# Patient Record
Sex: Male | Born: 1945 | Race: Black or African American | Hispanic: No | Marital: Married | Smoking: Never smoker
Health system: Southern US, Community
[De-identification: ages and names within clinical notes are randomized; demographics above are authoritative.]

## PROBLEM LIST (undated history)

## (undated) DIAGNOSIS — G709 Myoneural disorder, unspecified: Secondary | ICD-10-CM

## (undated) DIAGNOSIS — E785 Hyperlipidemia, unspecified: Secondary | ICD-10-CM

## (undated) DIAGNOSIS — I10 Essential (primary) hypertension: Secondary | ICD-10-CM

## (undated) HISTORY — DX: Hyperlipidemia, unspecified: E78.5

## (undated) HISTORY — DX: Myoneural disorder, unspecified: G70.9

---

## 2010-11-11 ENCOUNTER — Emergency Department (HOSPITAL_COMMUNITY): Payer: Medicaid Other

## 2010-11-11 ENCOUNTER — Inpatient Hospital Stay (HOSPITAL_COMMUNITY)
Admission: EM | Admit: 2010-11-11 | Discharge: 2010-11-14 | DRG: 093 | Disposition: A | Discharge status: Home Health | Payer: Medicaid Other | Attending: Internal Medicine | Admitting: Internal Medicine

## 2010-11-11 DIAGNOSIS — Z8673 Personal history of transient ischemic attack (TIA), and cerebral infarction without residual deficits: Secondary | ICD-10-CM

## 2010-11-11 DIAGNOSIS — R29898 Other symptoms and signs involving the musculoskeletal system: Secondary | ICD-10-CM | POA: Diagnosis present

## 2010-11-11 DIAGNOSIS — I10 Essential (primary) hypertension: Secondary | ICD-10-CM | POA: Diagnosis present

## 2010-11-11 DIAGNOSIS — Z7982 Long term (current) use of aspirin: Secondary | ICD-10-CM

## 2010-11-11 DIAGNOSIS — R209 Unspecified disturbances of skin sensation: Principal | ICD-10-CM | POA: Diagnosis present

## 2010-11-11 HISTORY — DX: Essential (primary) hypertension: I10

## 2010-11-11 LAB — CBC
HCT: 47.3 % (ref 39.0–52.0)
Hemoglobin: 16.2 g/dL (ref 13.0–17.0)
MCH: 29.7 pg (ref 26.0–34.0)
MCHC: 34.2 g/dL (ref 30.0–36.0)
MCV: 86.6 fL (ref 78.0–100.0)
Platelets: 199 10*3/uL (ref 150–400)
RBC: 5.46 MIL/uL (ref 4.22–5.81)
RDW: 14.2 % (ref 11.5–15.5)
WBC: 7 10*3/uL (ref 4.0–10.5)

## 2010-11-11 LAB — BASIC METABOLIC PANEL
BUN: 14 mg/dL (ref 6–23)
CO2: 30 mEq/L (ref 19–32)
Chloride: 102 mEq/L (ref 96–112)
Creatinine, Ser: 1.01 mg/dL (ref 0.50–1.35)

## 2010-11-11 LAB — DIFFERENTIAL
Basophils Absolute: 0 K/uL (ref 0.0–0.1)
Basophils Relative: 0 % (ref 0–1)
Eosinophils Absolute: 0.3 K/uL (ref 0.0–0.7)
Eosinophils Relative: 4 % (ref 0–5)
Lymphocytes Relative: 33 % (ref 12–46)
Lymphs Abs: 2.3 10*3/uL (ref 0.7–4.0)
Monocytes Absolute: 0.7 K/uL (ref 0.1–1.0)
Monocytes Relative: 11 % (ref 3–12)
Neutro Abs: 3.7 K/uL (ref 1.7–7.7)
Neutrophils Relative %: 53 % (ref 43–77)

## 2010-11-11 LAB — BASIC METABOLIC PANEL WITH GFR
Calcium: 9.3 mg/dL (ref 8.4–10.5)
GFR calc Af Amer: >60 mL/min (ref >60)
GFR calc non Af Amer: >60 mL/min (ref >60)
Glucose, Bld: 93 mg/dL (ref 70–99)
Potassium: 3.7 meq/L (ref 3.5–5.1)
Sodium: 139 meq/L (ref 135–145)

## 2010-11-12 ENCOUNTER — Encounter (HOSPITAL_COMMUNITY): Payer: Self-pay | Admitting: Radiology

## 2010-11-12 ENCOUNTER — Inpatient Hospital Stay (HOSPITAL_COMMUNITY): Payer: Medicaid Other

## 2010-11-12 LAB — COMPREHENSIVE METABOLIC PANEL
AST: 21 U/L (ref 0–37)
BUN: 13 mg/dL (ref 6–23)
CO2: 31 mEq/L (ref 19–32)
Calcium: 8.9 mg/dL (ref 8.4–10.5)
Creatinine, Ser: 0.99 mg/dL (ref 0.50–1.35)
GFR calc Af Amer: >60 mL/min (ref >60)
GFR calc non Af Amer: >60 mL/min (ref >60)
Glucose, Bld: 86 mg/dL (ref 70–99)

## 2010-11-12 LAB — DIFFERENTIAL
Basophils Absolute: 0 10*3/uL (ref 0.0–0.1)
Basophils Relative: 0 % (ref 0–1)
Eosinophils Absolute: 0.2 10*3/uL (ref 0.0–0.7)
Neutro Abs: 4 10*3/uL (ref 1.7–7.7)
Neutrophils Relative %: 50 % (ref 43–77)

## 2010-11-12 LAB — CBC
Hemoglobin: 15.9 g/dL (ref 13.0–17.0)
Platelets: 205 10*3/uL (ref 150–400)
RBC: 5.25 MIL/uL (ref 4.22–5.81)
WBC: 8 10*3/uL (ref 4.0–10.5)

## 2010-11-12 LAB — LIPID PANEL
Cholesterol: 157 mg/dL (ref 0–200)
HDL: 40 mg/dL (ref >39)
LDL Cholesterol: 76 mg/dL (ref 0–99)
Triglycerides: 206 mg/dL — ABNORMAL HIGH (ref <150)

## 2010-11-12 LAB — MAGNESIUM: Magnesium: 2.1 mg/dL (ref 1.5–2.5)

## 2010-11-12 LAB — TSH: TSH: 0.487 u[IU]/mL (ref 0.350–4.500)

## 2010-11-12 LAB — C-REACTIVE PROTEIN: CRP: 0.4 mg/dL — ABNORMAL LOW (ref <0.6)

## 2010-11-12 LAB — SEDIMENTATION RATE: Sed Rate: 4 mm/hr (ref 0–16)
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
## 2010-11-13 ENCOUNTER — Inpatient Hospital Stay (HOSPITAL_COMMUNITY): Payer: Medicaid Other

## 2010-11-13 LAB — HIV ANTIBODY (ROUTINE TESTING W REFLEX): HIV: NONREACTIVE

## 2010-11-16 LAB — SJOGRENS SYNDROME-A EXTRACTABLE NUCLEAR ANTIBODY: SSA (Ro) (ENA) Antibody, IgG: 2 AU/mL (ref <30)
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
## 2010-11-19 NOTE — Discharge Summary (Signed)
  [name], SHEAR NO.:  [id number]  MEDICAL RECORD NO.:  [id number]  LOCATION:  5148                         FACILITY:  [facility]  PHYSICIAN:  Jonny Ruiz, MD    DATE OF BIRTH:  [birth year]  DATE OF ADMISSION:  11/11/2010 DATE OF DISCHARGE:  11/14/2010                              DISCHARGE SUMMARY   DISCHARGE DIAGNOSES: 1. Weakness and numbness of the upper and lower extremities of unknown     etiology. 2. Essential hypertension.  CONSULTATIONS:  Dr. Levie Heritage from Neurology.  TESTS AND PROCEDURES:  CT scan of C-spine; cervical spondylosis.  CT scan of head; mild atrophy without evidence of acute intracranial hemorrhage, mass, or acute infarct.  MRI of lumbar spine; mild bilateral foraminal stenosis at L3-L4 due to facet arthropathy.  No nerve root thickening or clumping identified.  MRI thoracic spine; mild central stenosis at T7-T8 and T8-T9 due to disk protrusion.  Cervical MRI; moderate central and foraminal stenosis bilaterally at C3-C4, believed to be due to chronic spurring.  Small portions of the right side of cervical cord were not included in that exam due to the patient motion. Brain MRI without contrast; no acute intracranial abnormality. Noncontrast MRI appearance of the brain for age.  Renal ultrasound; approximate 1.5-cm cyst on the lower pole of the right kidney corresponding to an abnormality seen previously on MRI of the lumbar spine.  LABORATORY RESULTS:  HIV negative.  ANA negative.  CRP and sed rate normal.  CBC normal.  Magnesium normal.  CMP normal.  Vitamin B12 normal.  TSH normal.  HOSPITAL COURSE:  [name] is a 65 year old African American man with known history of hypertension and previous CVA which left him with some numbness of the upper and lower extremities who was admitted to the hospital because of increasing numbness and weakness of the upper and lower extremities with difficulty ambulating.  The patient  was subsequently admitted to the hospitalist service and received Neurology consultation from Dr. Hoy Morn.  On initial assessment, no etiology was found for his weakness and numbness.  Neurological examination was inconsistent and unable to pinpoint a specific CNS localization.  The patient pointed out that his symptoms had developed same day of a corticosteroid injection given by another provider in the past.  He insisted that it was the cause of it.  The patient received physical therapy evaluation and various times, his symptoms were inconsistent. The patient reported weakness and dysequilibrium but symptoms were off and on.  Neurologist felt that his symptoms were not related to his spinal stenosis in the cervical spine.  His laboratory assessment was unremarkable.  He had no complications during his hospital stay.  At this point, no further inpatient services were warranted.  CONDITION ON DISCHARGE:  Stable.  FOLLOWUP:  The patient is advised to follow up with Neurology in 2 weeks for nerve conduction studies/EMG.          ______________________________ Jonny Ruiz, MD     GL/MEDQ  D:  11/14/2010  T:  11/15/2010  Job:  [id number]  Electronically Signed by Jonny Ruiz MD on 11/19/2010 09:44:07 AM

## 2013-02-27 ENCOUNTER — Ambulatory Visit: Payer: Medicare Other

## 2013-02-27 ENCOUNTER — Ambulatory Visit (INDEPENDENT_AMBULATORY_CARE_PROVIDER_SITE_OTHER): Payer: Medicare Other | Admitting: Emergency Medicine

## 2013-02-27 VITALS — BP 118/70 | HR 60 | Temp 98.1°F | Resp 16 | Ht 68.5 in | Wt 219.0 lb

## 2013-02-27 DIAGNOSIS — G629 Polyneuropathy, unspecified: Secondary | ICD-10-CM

## 2013-02-27 DIAGNOSIS — K5900 Constipation, unspecified: Secondary | ICD-10-CM

## 2013-02-27 DIAGNOSIS — R109 Unspecified abdominal pain: Secondary | ICD-10-CM

## 2013-02-27 DIAGNOSIS — I10 Essential (primary) hypertension: Secondary | ICD-10-CM

## 2013-02-27 LAB — POCT CBC
Granulocyte percent: 57 %G (ref 37–80)
Lymph, poc: 1.8 (ref 0.6–3.4)
MCHC: 31.9 g/dL (ref 31.8–35.4)
MID (cbc): 0.5 (ref 0–0.9)
MPV: 9.2 fL (ref 0–99.8)
POC Granulocyte: 3 (ref 2–6.9)
POC LYMPH PERCENT: 34.5 %L (ref 10–50)
POC MID %: 8.5 %M (ref 0–12)
Platelet Count, POC: 219 10*3/uL (ref 142–424)
RBC: 5.38 M/uL (ref 4.69–6.13)
RDW, POC: 15.5 %

## 2013-02-27 LAB — POCT UA - MICROSCOPIC ONLY
Bacteria, U Microscopic: NEGATIVE
Crystals, Ur, HPF, POC: NEGATIVE
Epithelial cells, urine per micros: NEGATIVE
RBC, urine, microscopic: NEGATIVE
Yeast, UA: NEGATIVE

## 2013-02-27 LAB — COMPREHENSIVE METABOLIC PANEL
ALT: 13 U/L (ref 0–53)
Albumin: 4.3 g/dL (ref 3.5–5.2)
Alkaline Phosphatase: 87 U/L (ref 39–117)
BUN: 13 mg/dL (ref 6–23)
CO2: 30 mEq/L (ref 19–32)
Potassium: 3.8 mEq/L (ref 3.5–5.3)
Sodium: 141 mEq/L (ref 135–145)
Total Bilirubin: 0.5 mg/dL (ref 0.3–1.2)
Total Protein: 7.1 g/dL (ref 6.0–8.3)

## 2013-02-27 LAB — POCT URINALYSIS DIPSTICK
Glucose, UA: NEGATIVE
Ketones, UA: NEGATIVE
Leukocytes, UA: NEGATIVE
Nitrite, UA: NEGATIVE
Protein, UA: NEGATIVE
Spec Grav, UA: 1.02
Urobilinogen, UA: 0.2

## 2013-02-27 MED ORDER — POLYETHYLENE GLYCOL 3350 17 GM/SCOOP PO POWD: 17.0000 g | Freq: Every day | ORAL | Status: AC

## 2013-02-27 NOTE — Progress Notes (Signed)
[facility] [address] [place] [address] [phone]  Date:  02/27/2013   Name:  Ryan Lawson   DOB:  [birth year]   MRN:  [id number]  PCP:  Bernerd Limbo    Chief Complaint: Abdominal Pain   History of Present Illness:  Ryan Lawson is a 67 y.o. very pleasant male patient who presents with the following:  Intermittent mid abdominal pain for the past week. Says he went to the ER a week ago and was seen and put on percocet and a stool softener and his pain improved transiently but worsened today.  Denies nausea, vomiting or stool change, vomiting blood, blood in stool or black stool.  Says comes and goes. Not associated with activity, posture or diet.  No coryza or cough, fever or chills.  No new medications.   Had negative colonoscopy. Non smoker, non drinker  No improvement with over the counter medications or other home remedies. Denies other complaint or health concern today.   There are no active problems to display for this patient.   Past Medical History  Diagnosis Date  . Hypertension   . Neuromuscular disorder   . Hyperlipidemia     No past surgical history on file.  History  Substance Use Topics  . Smoking status: Never Smoker   . Smokeless tobacco: Not on file  . Alcohol Use: Not on file    No family history on file.  Not on File  Medication list has been reviewed and updated.  No current outpatient prescriptions on file prior to visit.   No current facility-administered medications on file prior to visit.    Review of Systems:  As per HPI, otherwise negative.    Physical Examination: Filed Vitals:   02/27/13 1203  BP: 118/70  Pulse: 60  Temp: 98.1 F (36.7 C)  Resp: 16   Filed Vitals:   02/27/13 1203  Height: 5' 8.5" (1.74 m)  Weight: 219 lb (99.338 kg)   Body mass index is 32.81 kg/(m^2). Ideal Body Weight: Weight in (lb) to have BMI = 25: 166.5  GEN: WDWN, NAD, Non-toxic, A & O x 3 HEENT: Atraumatic,  Normocephalic. Neck supple. No masses, No LAD. Ears and Nose: No external deformity. CV: RRR, No M/G/R. No JVD. No thrill. No extra heart sounds. PULM: CTA B, no wheezes, crackles, rhonchi. No retractions. No resp. distress. No accessory muscle use. ABD: S, NT, ND, +BS. No rebound. No HSM. EXTR: No c/c/e NEURO Normal gait.  PSYCH: Normally interactive. Conversant. Not depressed or anxious appearing.  Calm demeanor.    Assessment and Plan: Abdominal pain. Constipation miralax Follow up on labs Signed,  Phillips Odor, MD  [facility] reading (PRIMARY) by  Dr. Dareen Piano.  Constipation. No free air or obstruction  .   Results for orders placed in visit on 02/27/13  POCT CBC      Result Value Range   WBC 5.3  4.6 - 10.2 K/uL   Lymph, poc 1.8  0.6 - 3.4   POC LYMPH PERCENT 34.5  10 - 50 %L   MID (cbc) 0.5  0 - 0.9   POC MID % 8.5  0 - 12 %M   POC Granulocyte 3.0  2 - 6.9   Granulocyte percent 57.0  37 - 80 %G   RBC 5.38  4.69 - 6.13 M/uL   Hemoglobin 15.9  14.1 - 18.1 g/dL   HCT, POC 29.5  62.1 - 53.7 %   MCV 92.7  80 - 97 fL   MCH, POC 29.6  27 - 31.2 pg   MCHC 31.9  31.8 - 35.4 g/dL   RDW, POC 14.7     Platelet Count, POC 219  142 - 424 K/uL   MPV 9.2  0 - 99.8 fL  POCT URINALYSIS DIPSTICK      Result Value Range   Color, UA yellow     Clarity, UA clear     Glucose, UA neg     Bilirubin, UA neg     Ketones, UA neg     Spec Grav, UA 1.020     Blood, UA neg     pH, UA 7.5     Protein, UA neg     Urobilinogen, UA 0.2     Nitrite, UA neg     Leukocytes, UA Negative    POCT UA - MICROSCOPIC ONLY      Result Value Range   WBC, Ur, HPF, POC 0-12     RBC, urine, microscopic neg     Bacteria, U Microscopic neg     Mucus, UA neg     Epithelial cells, urine per micros neg     Crystals, Ur, HPF, POC neg     Casts, Ur, LPF, POC neg     Yeast, UA neg

## 2013-02-27 NOTE — Patient Instructions (Signed)

## 2013-02-28 LAB — H. PYLORI ANTIBODY, IGG: H Pylori IgG: 4.25 {ISR} — ABNORMAL HIGH

## 2013-03-01 ENCOUNTER — Other Ambulatory Visit: Payer: Self-pay | Admitting: Emergency Medicine

## 2013-03-01 MED ORDER — AMOXICILL-CLARITHRO-LANSOPRAZ PO MISC: Freq: Two times a day (BID) | ORAL | Status: AC

## 2013-06-02 ENCOUNTER — Telehealth: Payer: Self-pay

## 2013-06-02 NOTE — Telephone Encounter (Signed)
PATIENT IS TAKING POLYETHYLENE POWDER AND IS WONDERING IF IT IS NORMAL THAT HIS STOOL HAS BEEN GREEN FOR A WHILE SINCE STARTING TO TAKE THE MEDICATION [phone]

## 2013-06-04 NOTE — Telephone Encounter (Signed)
I am not aware that MiraLax causes this.  If he is concerned would advise him to RTC for further eval

## 2013-06-04 NOTE — Telephone Encounter (Signed)
Patient notified and voiced understanding. Stool color is returning to normal but will RTC if continues

## 2013-08-23 DIAGNOSIS — Z0289 Encounter for other administrative examinations: Secondary | ICD-10-CM

## 2014-03-04 IMAGING — CR DG ABDOMEN ACUTE W/ 1V CHEST
3 series · 3 of 3 positions shown · non-contrast
Comparison: 02/09/2013

CLINICAL DATA: Intermittent mid abdominal pain for past week,
history essential benign hypertension

EXAM:
ACUTE ABDOMEN SERIES (ABDOMEN 2 VIEW & CHEST 1 VIEW)

[PA]
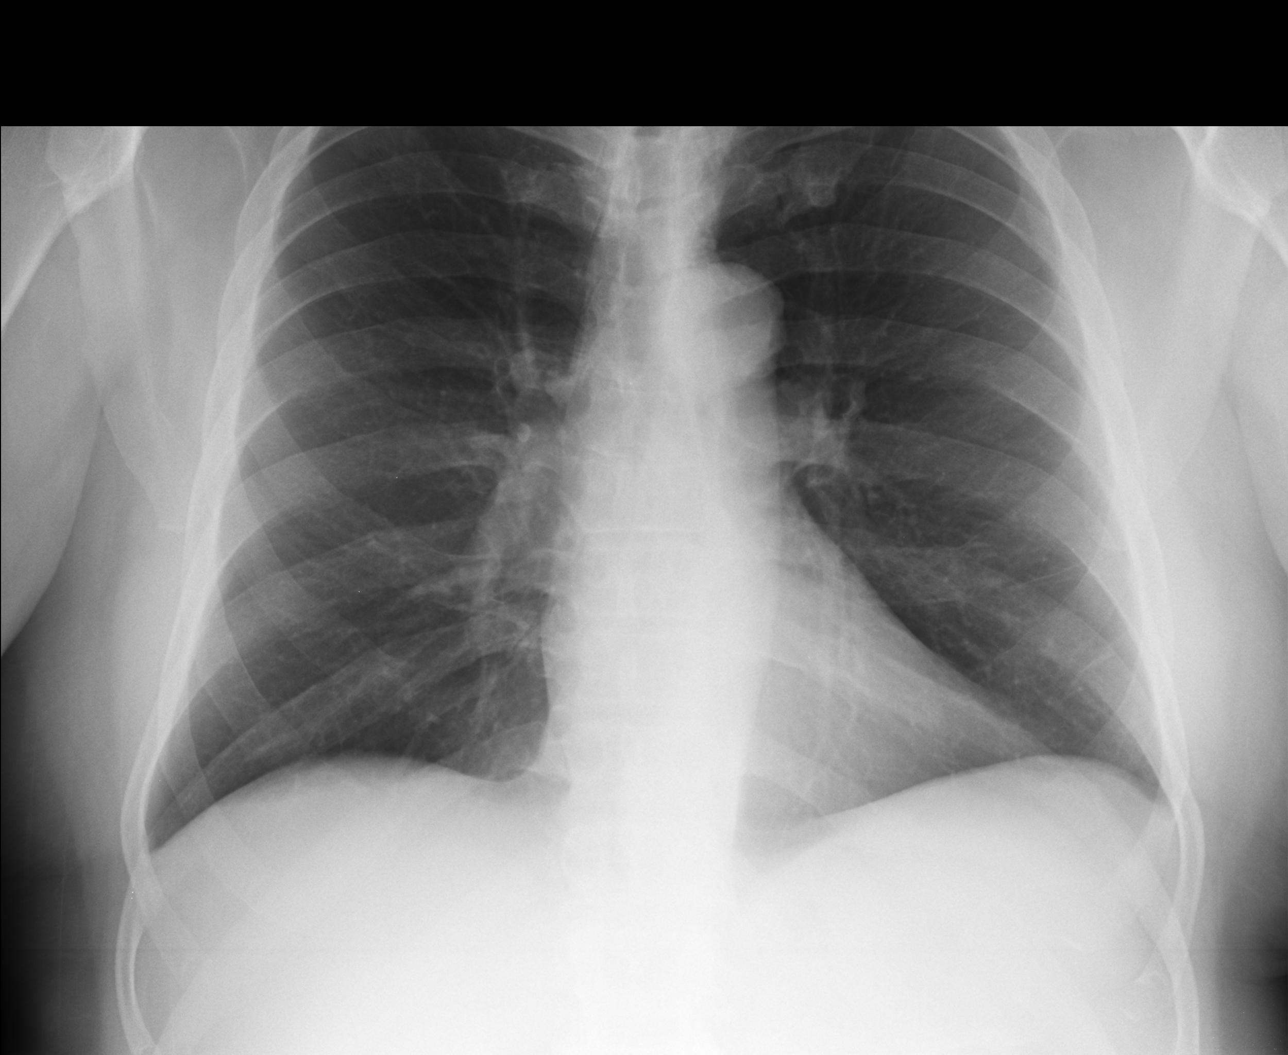

[AP (1 of 2)]
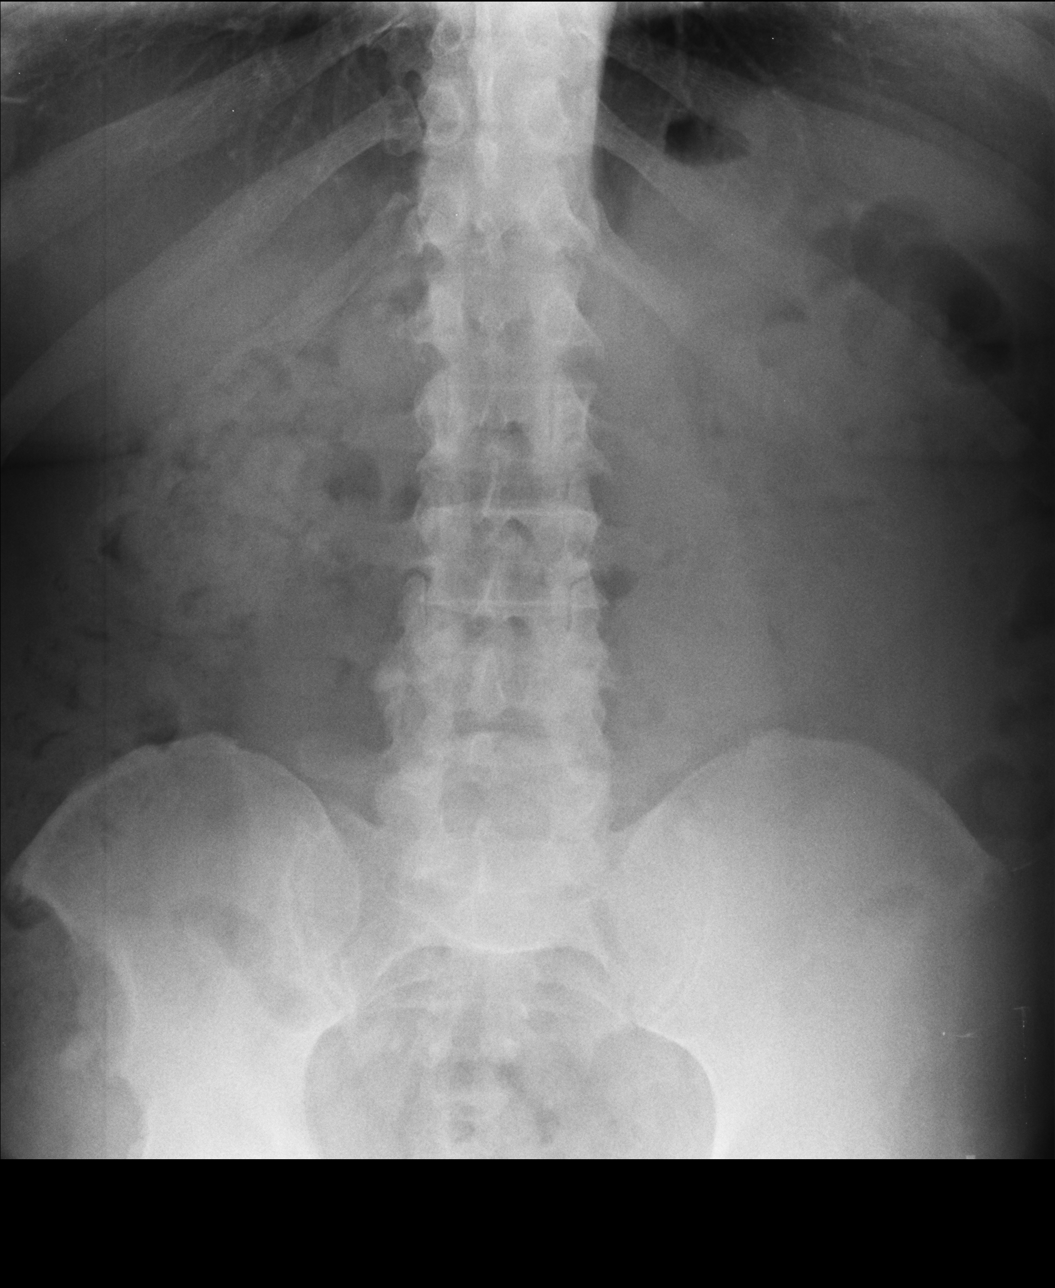

[AP (2 of 2)]
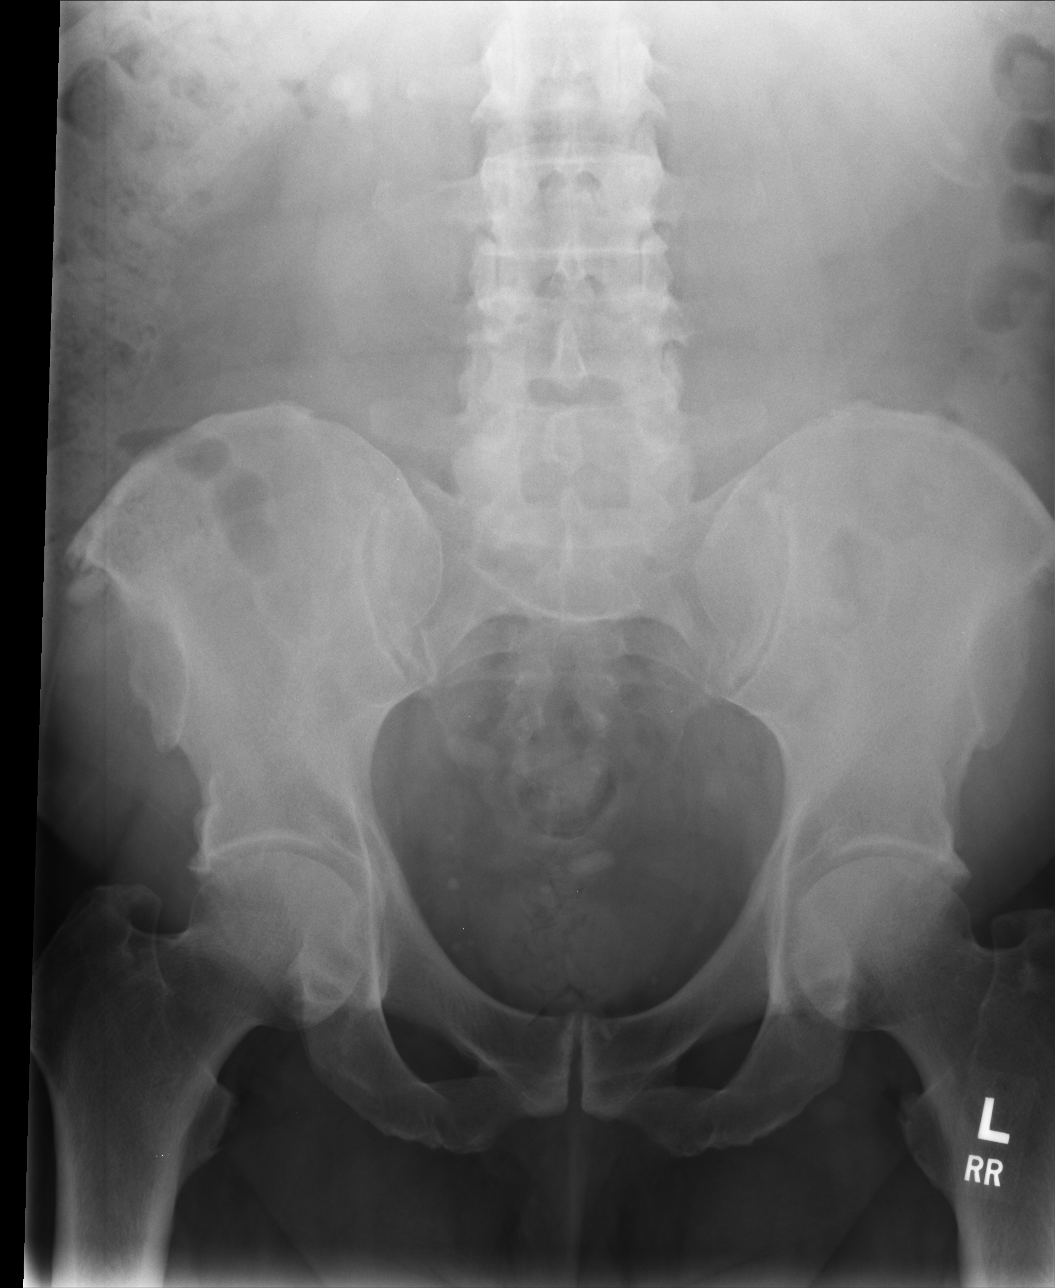

[3 of 3 positions shown; findings below may reference images not displayed]

FINDINGS: Normal heart size, mediastinal contours, and pulmonary vascularity.

Lungs hyperinflated with mild peribronchial thickening.

No infiltrate, pleural effusion or pneumothorax.

Normal bowel gas pattern.

No bowel dilatation, bowel wall thickening or free intraperitoneal
air.

Probable medication tablet in pelvis.

Scattered pelvic phleboliths.

Right paraspinal density is identified in the right upper quadrant
at the L1 level, 20 x 14 mm in size, likely artifact, with no right
kidney stone identified on the preceding study.
IMPRESSION: Normal bowel gas pattern.

Scattered artifacts.

Question COPD.
